# Patient Record
Sex: Female | Born: 1975 | Race: White | Hispanic: No | Marital: Married | Smoking: Current every day smoker
Health system: Southern US, Community
[De-identification: ages and names within clinical notes are randomized; demographics above are authoritative.]

## PROBLEM LIST (undated history)

## (undated) DIAGNOSIS — F329 Major depressive disorder, single episode, unspecified: Secondary | ICD-10-CM

## (undated) DIAGNOSIS — N390 Urinary tract infection, site not specified: Secondary | ICD-10-CM

## (undated) DIAGNOSIS — G43909 Migraine, unspecified, not intractable, without status migrainosus: Secondary | ICD-10-CM

## (undated) DIAGNOSIS — B019 Varicella without complication: Secondary | ICD-10-CM

## (undated) DIAGNOSIS — F32A Depression, unspecified: Secondary | ICD-10-CM

## (undated) DIAGNOSIS — N200 Calculus of kidney: Secondary | ICD-10-CM

## (undated) HISTORY — DX: Migraine, unspecified, not intractable, without status migrainosus: G43.909

## (undated) HISTORY — DX: Calculus of kidney: N20.0

## (undated) HISTORY — DX: Major depressive disorder, single episode, unspecified: F32.9

## (undated) HISTORY — DX: Depression, unspecified: F32.A

## (undated) HISTORY — DX: Varicella without complication: B01.9

## (undated) HISTORY — DX: Urinary tract infection, site not specified: N39.0

---
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
## 1996-09-17 HISTORY — PX: ECTOPIC PREGNANCY SURGERY: SHX613

## 1998-09-17 HISTORY — PX: ECTOPIC PREGNANCY SURGERY: SHX613

## 1999-05-18 ENCOUNTER — Inpatient Hospital Stay (HOSPITAL_COMMUNITY): Admission: EM | Admit: 1999-05-18 | Discharge: 1999-05-19 | Payer: Self-pay | Admitting: Obstetrics and Gynecology

## 1999-06-16 ENCOUNTER — Other Ambulatory Visit: Admission: RE | Admit: 1999-06-16 | Discharge: 1999-06-16 | Payer: Self-pay | Admitting: Obstetrics and Gynecology

## 2000-07-22 ENCOUNTER — Other Ambulatory Visit: Admission: RE | Admit: 2000-07-22 | Discharge: 2000-07-22 | Payer: Self-pay | Admitting: Obstetrics and Gynecology

## 2001-02-27 ENCOUNTER — Encounter: Payer: Self-pay | Admitting: Obstetrics and Gynecology

## 2001-02-27 ENCOUNTER — Ambulatory Visit (HOSPITAL_COMMUNITY): Admission: RE | Admit: 2001-02-27 | Discharge: 2001-02-27 | Payer: Self-pay | Admitting: Obstetrics and Gynecology

## 2001-03-27 ENCOUNTER — Encounter: Payer: Self-pay | Admitting: Obstetrics and Gynecology

## 2001-03-27 ENCOUNTER — Ambulatory Visit (HOSPITAL_COMMUNITY): Admission: RE | Admit: 2001-03-27 | Discharge: 2001-03-27 | Payer: Self-pay | Admitting: Obstetrics and Gynecology

## 2001-07-25 ENCOUNTER — Other Ambulatory Visit: Admission: RE | Admit: 2001-07-25 | Discharge: 2001-07-25 | Payer: Self-pay | Admitting: Obstetrics and Gynecology

## 2003-01-25 ENCOUNTER — Ambulatory Visit (HOSPITAL_COMMUNITY): Admission: RE | Admit: 2003-01-25 | Discharge: 2003-01-25 | Payer: Self-pay | Admitting: Obstetrics and Gynecology

## 2003-01-25 ENCOUNTER — Encounter: Payer: Self-pay | Admitting: Obstetrics and Gynecology

## 2006-12-04 ENCOUNTER — Ambulatory Visit (HOSPITAL_COMMUNITY): Admission: RE | Admit: 2006-12-04 | Discharge: 2006-12-04 | Payer: Self-pay | Admitting: Family Medicine

## 2015-01-03 ENCOUNTER — Emergency Department: Admit: 2015-01-03 | Disposition: A | Discharge status: Home / Self Care | Payer: Self-pay | Admitting: Student

## 2016-01-20 ENCOUNTER — Encounter: Payer: Self-pay | Admitting: Family Medicine

## 2016-01-20 ENCOUNTER — Ambulatory Visit (INDEPENDENT_AMBULATORY_CARE_PROVIDER_SITE_OTHER): Payer: Self-pay | Admitting: Family Medicine

## 2016-01-20 VITALS — BP 122/76 | HR 100 | Temp 98.8°F | Ht 60.25 in | Wt 104.8 lb

## 2016-01-20 DIAGNOSIS — R6889 Other general symptoms and signs: Secondary | ICD-10-CM

## 2016-01-20 DIAGNOSIS — F909 Attention-deficit hyperactivity disorder, unspecified type: Secondary | ICD-10-CM

## 2016-01-20 DIAGNOSIS — Z0001 Encounter for general adult medical examination with abnormal findings: Secondary | ICD-10-CM

## 2016-01-20 DIAGNOSIS — F900 Attention-deficit hyperactivity disorder, predominantly inattentive type: Secondary | ICD-10-CM

## 2016-01-20 DIAGNOSIS — F329 Major depressive disorder, single episode, unspecified: Secondary | ICD-10-CM

## 2016-01-20 DIAGNOSIS — F32A Depression, unspecified: Secondary | ICD-10-CM

## 2016-01-20 MED ORDER — SERTRALINE HCL 50 MG PO TABS: 50.0000 mg | ORAL_TABLET | Freq: Every day | ORAL | Status: AC

## 2016-01-20 NOTE — Patient Instructions (Addendum)
It was nice to see you today.  I have refilled your Zoloft.  Let me know about your mammogram.  Follow up:  Return in about 1 year (around 01/19/2017) for Annual physical.  Take care  Dr. Lacinda Axon  Health Maintenance, Female Adopting a healthy lifestyle and getting preventive care can go a long way to promote health and wellness. Talk with your health care provider about what schedule of regular examinations is right for you. This is a good chance for you to check in with your provider about disease prevention and staying healthy. In between checkups, there are plenty of things you can do on your own. Experts have done a lot of research about which lifestyle changes and preventive measures are most likely to keep you healthy. Ask your health care provider for more information. WEIGHT AND DIET  Eat a healthy diet  Be sure to include plenty of vegetables, fruits, low-fat dairy products, and lean protein.  Do not eat a lot of foods high in solid fats, added sugars, or salt.  Get regular exercise. This is one of the most important things you can do for your health.  Most adults should exercise for at least 150 minutes each week. The exercise should increase your heart rate and make you sweat (moderate-intensity exercise).  Most adults should also do strengthening exercises at least twice a week. This is in addition to the moderate-intensity exercise.  Maintain a healthy weight  Body mass index (BMI) is a measurement that can be used to identify possible weight problems. It estimates body fat based on height and weight. Your health care provider can help determine your BMI and help you achieve or maintain a healthy weight.  For females 60 years of age and older:   A BMI below 18.5 is considered underweight.  A BMI of 18.5 to 24.9 is normal.  A BMI of 25 to 29.9 is considered overweight.  A BMI of 30 and above is considered obese.  Watch levels of cholesterol and blood lipids  You  should start having your blood tested for lipids and cholesterol at 41 years of age, then have this test every 5 years.  You may need to have your cholesterol levels checked more often if:  Your lipid or cholesterol levels are high.  You are older than 41 years of age.  You are at high risk for heart disease.  CANCER SCREENING   Lung Cancer  Lung cancer screening is recommended for adults 53-54 years old who are at high risk for lung cancer because of a history of smoking.  A yearly low-dose CT scan of the lungs is recommended for people who:  Currently smoke.  Have quit within the past 15 years.  Have at least a 30-pack-year history of smoking. A pack year is smoking an average of one pack of cigarettes a day for 1 year.  Yearly screening should continue until it has been 15 years since you quit.  Yearly screening should stop if you develop a health problem that would prevent you from having lung cancer treatment.  Breast Cancer  Practice breast self-awareness. This means understanding how your breasts normally appear and feel.  It also means doing regular breast self-exams. Let your health care provider know about any changes, no matter how small.  If you are in your 20s or 30s, you should have a clinical breast exam (CBE) by a health care provider every 1-3 years as part of a regular health exam.  If  you are 40 or older, have a CBE every year. Also consider having a breast X-ray (mammogram) every year.  If you have a family history of breast cancer, talk to your health care provider about genetic screening.  If you are at high risk for breast cancer, talk to your health care provider about having an MRI and a mammogram every year.  Breast cancer gene (BRCA) assessment is recommended for women who have family members with BRCA-related cancers. BRCA-related cancers include:  Breast.  Ovarian.  Tubal.  Peritoneal cancers.  Results of the assessment will determine  the need for genetic counseling and BRCA1 and BRCA2 testing. Cervical Cancer Your health care provider may recommend that you be screened regularly for cancer of the pelvic organs (ovaries, uterus, and vagina). This screening involves a pelvic examination, including checking for microscopic changes to the surface of your cervix (Pap test). You may be encouraged to have this screening done every 3 years, beginning at age 21.  For women ages 30-65, health care providers may recommend pelvic exams and Pap testing every 3 years, or they may recommend the Pap and pelvic exam, combined with testing for human papilloma virus (HPV), every 5 years. Some types of HPV increase your risk of cervical cancer. Testing for HPV may also be done on women of any age with unclear Pap test results.  Other health care providers may not recommend any screening for nonpregnant women who are considered low risk for pelvic cancer and who do not have symptoms. Ask your health care provider if a screening pelvic exam is right for you.  If you have had past treatment for cervical cancer or a condition that could lead to cancer, you need Pap tests and screening for cancer for at least 20 years after your treatment. If Pap tests have been discontinued, your risk factors (such as having a new sexual partner) need to be reassessed to determine if screening should resume. Some women have medical problems that increase the chance of getting cervical cancer. In these cases, your health care provider may recommend more frequent screening and Pap tests. Colorectal Cancer  This type of cancer can be detected and often prevented.  Routine colorectal cancer screening usually begins at 41 years of age and continues through 41 years of age.  Your health care provider may recommend screening at an earlier age if you have risk factors for colon cancer.  Your health care provider may also recommend using home test kits to check for hidden blood  in the stool.  A small camera at the end of a tube can be used to examine your colon directly (sigmoidoscopy or colonoscopy). This is done to check for the earliest forms of colorectal cancer.  Routine screening usually begins at age 50.  Direct examination of the colon should be repeated every 5-10 years through 41 years of age. However, you may need to be screened more often if early forms of precancerous polyps or small growths are found. Skin Cancer  Check your skin from head to toe regularly.  Tell your health care provider about any new moles or changes in moles, especially if there is a change in a mole's shape or color.  Also tell your health care provider if you have a mole that is larger than the size of a pencil eraser.  Always use sunscreen. Apply sunscreen liberally and repeatedly throughout the day.  Protect yourself by wearing long sleeves, pants, a wide-brimmed hat, and sunglasses whenever you are   outside. HEART DISEASE, DIABETES, AND HIGH BLOOD PRESSURE   High blood pressure causes heart disease and increases the risk of stroke. High blood pressure is more likely to develop in:  People who have blood pressure in the high end of the normal range (130-139/85-89 mm Hg).  People who are overweight or obese.  People who are African American.  If you are 18-39 years of age, have your blood pressure checked every 3-5 years. If you are 40 years of age or older, have your blood pressure checked every year. You should have your blood pressure measured twice--once when you are at a hospital or clinic, and once when you are not at a hospital or clinic. Record the average of the two measurements. To check your blood pressure when you are not at a hospital or clinic, you can use:  An automated blood pressure machine at a pharmacy.  A home blood pressure monitor.  If you are between 55 years and 79 years old, ask your health care provider if you should take aspirin to prevent  strokes.  Have regular diabetes screenings. This involves taking a blood sample to check your fasting blood sugar level.  If you are at a normal weight and have a low risk for diabetes, have this test once every three years after 41 years of age.  If you are overweight and have a high risk for diabetes, consider being tested at a younger age or more often. PREVENTING INFECTION  Hepatitis B  If you have a higher risk for hepatitis B, you should be screened for this virus. You are considered at high risk for hepatitis B if:  You were born in a country where hepatitis B is common. Ask your health care provider which countries are considered high risk.  Your parents were born in a high-risk country, and you have not been immunized against hepatitis B (hepatitis B vaccine).  You have HIV or AIDS.  You use needles to inject street drugs.  You live with someone who has hepatitis B.  You have had sex with someone who has hepatitis B.  You get hemodialysis treatment.  You take certain medicines for conditions, including cancer, organ transplantation, and autoimmune conditions. Hepatitis C  Blood testing is recommended for:  Everyone born from 1945 through 1965.  Anyone with known risk factors for hepatitis C. Sexually transmitted infections (STIs)  You should be screened for sexually transmitted infections (STIs) including gonorrhea and chlamydia if:  You are sexually active and are younger than 41 years of age.  You are older than 41 years of age and your health care provider tells you that you are at risk for this type of infection.  Your sexual activity has changed since you were last screened and you are at an increased risk for chlamydia or gonorrhea. Ask your health care provider if you are at risk.  If you do not have HIV, but are at risk, it may be recommended that you take a prescription medicine daily to prevent HIV infection. This is called pre-exposure prophylaxis  (PrEP). You are considered at risk if:  You are sexually active and do not regularly use condoms or know the HIV status of your partner(s).  You take drugs by injection.  You are sexually active with a partner who has HIV. Talk with your health care provider about whether you are at high risk of being infected with HIV. If you choose to begin PrEP, you should first be tested for HIV.   You should then be tested every 3 months for as long as you are taking PrEP.  PREGNANCY   If you are premenopausal and you may become pregnant, ask your health care provider about preconception counseling.  If you may become pregnant, take 400 to 800 micrograms (mcg) of folic acid every day.  If you want to prevent pregnancy, talk to your health care provider about birth control (contraception). OSTEOPOROSIS AND MENOPAUSE   Osteoporosis is a disease in which the bones lose minerals and strength with aging. This can result in serious bone fractures. Your risk for osteoporosis can be identified using a bone density scan.  If you are 32 years of age or older, or if you are at risk for osteoporosis and fractures, ask your health care provider if you should be screened.  Ask your health care provider whether you should take a calcium or vitamin D supplement to lower your risk for osteoporosis.  Menopause may have certain physical symptoms and risks.  Hormone replacement therapy may reduce some of these symptoms and risks. Talk to your health care provider about whether hormone replacement therapy is right for you.  HOME CARE INSTRUCTIONS   Schedule regular health, dental, and eye exams.  Stay current with your immunizations.   Do not use any tobacco products including cigarettes, chewing tobacco, or electronic cigarettes.  If you are pregnant, do not drink alcohol.  If you are breastfeeding, limit how much and how often you drink alcohol.  Limit alcohol intake to no more than 1 drink per day for  nonpregnant women. One drink equals 12 ounces of beer, 5 ounces of wine, or 1 ounces of hard liquor.  Do not use street drugs.  Do not share needles.  Ask your health care provider for help if you need support or information about quitting drugs.  Tell your health care provider if you often feel depressed.  Tell your health care provider if you have ever been abused or do not feel safe at home.   This information is not intended to replace advice given to you by your health care provider. Make sure you discuss any questions you have with your health care provider.   Document Released: 03/19/2011 Document Revised: 09/24/2014 Document Reviewed: 08/05/2013 Elsevier Interactive Patient Education [employer].

## 2016-01-20 NOTE — Progress Notes (Signed)
Pre visit review using our clinic review tool, if applicable. No additional management support is needed unless otherwise documented below in the visit note. 

## 2016-01-23 DIAGNOSIS — Z0000 Encounter for general adult medical examination without abnormal findings: Secondary | ICD-10-CM | POA: Insufficient documentation

## 2016-01-23 DIAGNOSIS — F909 Attention-deficit hyperactivity disorder, unspecified type: Secondary | ICD-10-CM | POA: Insufficient documentation

## 2016-01-23 DIAGNOSIS — Z0001 Encounter for general adult medical examination with abnormal findings: Secondary | ICD-10-CM | POA: Insufficient documentation

## 2016-01-23 DIAGNOSIS — F329 Major depressive disorder, single episode, unspecified: Secondary | ICD-10-CM | POA: Insufficient documentation

## 2016-01-23 DIAGNOSIS — F32A Depression, unspecified: Secondary | ICD-10-CM | POA: Insufficient documentation

## 2016-01-23 NOTE — Assessment & Plan Note (Signed)
Patient with a reported history of ADHD. I informed her that I will not prescribe unless she has a formal evaluation by psychiatry or psychology.

## 2016-01-23 NOTE — Assessment & Plan Note (Signed)
Pap smear up-to-date. Declines mammogram at this time secondary to lack of insurance. Declined screening labs today secondary to lack of insurance. Is not interested in quitting smoking at this time. Tdap up today. Declines Pneumovax.
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
## 2016-01-23 NOTE — Assessment & Plan Note (Signed)
Uncontrolled this time. Given prior good response to Zoloft, will restart today. Rx sent.
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
## 2020-05-28 ENCOUNTER — Ambulatory Visit: Payer: Self-pay
